# Patient Record
Sex: Male | Born: 2000 | Race: White | Hispanic: No | Marital: Single | Smoking: Never smoker
Health system: Southern US, Community
[De-identification: ages and names within clinical notes are randomized; demographics above are authoritative.]

## PROBLEM LIST (undated history)

## (undated) DIAGNOSIS — F909 Attention-deficit hyperactivity disorder, unspecified type: Secondary | ICD-10-CM

## (undated) HISTORY — DX: Attention-deficit hyperactivity disorder, unspecified type: F90.9

## (undated) HISTORY — PX: TYMPANOSTOMY TUBE PLACEMENT: SHX32

---
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
## 2020-01-23 ENCOUNTER — Other Ambulatory Visit: Payer: Self-pay

## 2020-01-23 ENCOUNTER — Encounter: Payer: Self-pay | Admitting: Internal Medicine

## 2020-01-23 ENCOUNTER — Ambulatory Visit (INDEPENDENT_AMBULATORY_CARE_PROVIDER_SITE_OTHER): Payer: BC Managed Care – PPO | Admitting: Internal Medicine

## 2020-01-23 DIAGNOSIS — Z0000 Encounter for general adult medical examination without abnormal findings: Secondary | ICD-10-CM | POA: Diagnosis not present

## 2020-01-23 DIAGNOSIS — F900 Attention-deficit hyperactivity disorder, predominantly inattentive type: Secondary | ICD-10-CM

## 2020-01-23 MED ORDER — AMPHETAMINE-DEXTROAMPHETAMINE 20 MG PO TABS: 20.0000 mg | ORAL_TABLET | Freq: Every day | ORAL | 0 refills | Status: DC

## 2020-01-23 MED ORDER — AMPHETAMINE-DEXTROAMPHETAMINE 5 MG PO TABS: 5.0000 mg | ORAL_TABLET | Freq: Every day | ORAL | 0 refills | Status: DC

## 2020-01-23 NOTE — Assessment & Plan Note (Signed)
Uses just for school Will add 5mg  dose for afternoon when he has late classes

## 2020-01-23 NOTE — Progress Notes (Signed)
Subjective:    Patient ID: Thomas Nunez, male    DOB: [birth year], 19 y.o.   MRN: [id number]  HPI Here for physical This visit occurred during the SARS-CoV-2 public health emergency.  Safety protocols were in place, including screening questions prior to the visit, additional usage of staff PPE, and extensive cleaning of exam room while observing appropriate contact time as indicated for disinfecting solutions.   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 amphetamine-dextroamphetamine (ADDERALL) 20 MG tablet Take 1 tablet (20 mg total) by mouth daily. For school 30 tablet 0   No current facility-administered medications on file prior to visit.    No Known Allergies  Past Medical History:  Diagnosis Date  . ADHD     Past Surgical History:  Procedure Laterality Date  . TYMPANOSTOMY TUBE PLACEMENT      Family History  Problem Relation Age of Onset  . Breast cancer Maternal Grandmother   . Depression Neg Hx   . Heart disease Neg Hx     Social History   Socioeconomic History  . Marital status: Single    Spouse name: Not on file  . Number of children: Not on file  . Years of education: Not on file  . Highest education level: Not on file  Occupational History  . Not on file  Tobacco Use  . Smoking status: Never Smoker  . Smokeless tobacco: Never Used  Substance and Sexual Activity  . Alcohol use: No  . Drug use: No  . Sexual activity: Not on file  Other Topics Concern  . 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   Social Determinants of Health   Financial Resource Strain:   . Difficulty of Paying Living Expenses:   Food Insecurity:   . Worried About Programme researcher, broadcasting/film/video in the Last Year:   . Barista in the Last Year:   Transportation Needs:   . Freight forwarder (Medical):   Marland Kitchen Lack of Transportation (Non-Medical):   Physical Activity:   . Days of Exercise per Week:   . Minutes of Exercise per Session:   Stress:   . Feeling of Stress :   Social Connections:   . Frequency of Communication with Friends and Family:   . Frequency of Social Gatherings with Friends and Family:   . Attends Religious Services:   . Active Member of Clubs or Organizations:   . Attends Banker Meetings:   Marland Kitchen Marital Status:   Intimate Partner Violence:   . Fear of Current or Ex-Partner:   . Emotionally Abused:   Marland Kitchen Physically Abused:   . Sexually Abused:    Review of Systems  Constitutional: Negative for fatigue and unexpected weight change.       Wears seat belt  HENT: Negative for dental problem, hearing loss and tinnitus.   Eyes: Negative for visual disturbance.       No  diplopia or unilateral vision  Respiratory: Negative for cough, chest tightness and shortness of breath.   Cardiovascular: Negative for chest pain, palpitations and leg swelling.  Gastrointestinal: Negative for blood in stool and constipation.       No heartburn  Endocrine: Negative for polydipsia and polyuria.  Genitourinary: Negative for difficulty urinating and urgency.       Discussed safe sex  Musculoskeletal: Negative for arthralgias and joint swelling.       Occ back pain from golf  Skin: Negative for rash.  Allergic/Immunologic: Positive for environmental allergies. Negative for immunocompromised state.       Mild symptoms ---no meds  Neurological: Negative for  dizziness, syncope, light-headedness and headaches.  Hematological: Negative for adenopathy. Does not bruise/bleed easily.  Psychiatric/Behavioral: Negative for dysphoric mood and sleep disturbance. The patient is not nervous/anxious.        Objective:   Physical Exam  Constitutional: He is oriented to person, place, and time. He appears well-developed. No distress.  HENT:  Head: Normocephalic and atraumatic.  Right Ear: External ear normal.  Left Ear: External ear normal.  Mouth/Throat: Oropharynx is clear and moist. No oropharyngeal exudate.  Eyes: Pupils are equal, round, and reactive to light. Conjunctivae are normal.  Neck: No thyromegaly present.  Cardiovascular: Normal rate, regular rhythm, normal heart sounds and intact distal pulses. Exam reveals no gallop.  No murmur heard. Respiratory: Effort normal and breath sounds normal. No respiratory distress. He has no wheezes. He has no rales.  GI: Soft. There is no abdominal tenderness.  Genitourinary:    Genitourinary Comments: Normal testes   Musculoskeletal:        General: No tenderness or edema.  Lymphadenopathy:    He has no cervical adenopathy.  Neurological: He is alert and oriented to person, place, and time.  Skin: No rash noted. No erythema.  Psychiatric: He has a normal mood and affect. His behavior is normal.           Assessment & Plan:

## 2020-01-23 NOTE — Assessment & Plan Note (Signed)
Healthy Discussed safety, seat belts, safe sex, substance avoidance Prefers no COVID or flu vaccines Working on fitness

## 2020-02-03 ENCOUNTER — Other Ambulatory Visit: Payer: Self-pay | Admitting: Internal Medicine

## 2020-02-03 NOTE — Telephone Encounter (Signed)
Name of Medication: adderall 20 mg Name of Pharmacy:[facility] [place] or Written Date and Quantity: # 30 on 12/10/19 Last Office Visit and Type: 01/23/20 annual Next Office Visit and Type: 01/25/21 CPX  Name of Medication: adderall 5 mg Name of Pharmacy:[facility] [place] or Written Date and Quantity: new fill Last Office Visit and Type: 01/23/20 annual Next Office Visit and Type: 01/25/21 CPX

## 2020-02-03 NOTE — Telephone Encounter (Signed)
Patient's mother called to request refill  Adderall   She stated that he is completely out of medication.   [facility]

## 2020-02-04 MED ORDER — AMPHETAMINE-DEXTROAMPHETAMINE 5 MG PO TABS: 5.0000 mg | ORAL_TABLET | Freq: Every day | ORAL | 0 refills | Status: DC

## 2020-02-04 MED ORDER — AMPHETAMINE-DEXTROAMPHETAMINE 20 MG PO TABS: 20.0000 mg | ORAL_TABLET | Freq: Every day | ORAL | 0 refills | Status: DC

## 2020-03-09 ENCOUNTER — Telehealth: Payer: Self-pay | Admitting: Internal Medicine

## 2020-03-09 NOTE — Telephone Encounter (Signed)
Patient's mother came in office and is requesting pcp fill out form for college gold and ADD form. Please advise. Form placed in tower.

## 2020-03-10 NOTE — Telephone Encounter (Signed)
Form placed in Dr Letvak's inbox on his desk 

## 2020-03-15 NOTE — Telephone Encounter (Signed)
Form done as best as I can We do not have a full ADHD evaluation and documentation since it goes back many years. Can send my last note--but not sure that will satisfy them. May need separate visit to document DSM criteria, etc

## 2020-03-15 NOTE — Telephone Encounter (Signed)
Tried to call mom, Judeth Cornfield. VM is full. He also needs a vision screening unless he has been to the eye doctor in the last year.

## 2020-03-19 NOTE — Telephone Encounter (Signed)
Spoke to pt's Mom. She said they were not too picky about it last year. Will let us know if they need a copy of the OV Note. Forms up front for his to pickup.

## 2020-03-26 ENCOUNTER — Other Ambulatory Visit: Payer: Self-pay | Admitting: Internal Medicine

## 2020-03-26 MED ORDER — AMPHETAMINE-DEXTROAMPHETAMINE 5 MG PO TABS: 5.0000 mg | ORAL_TABLET | Freq: Every day | ORAL | 0 refills | Status: DC

## 2020-03-26 MED ORDER — AMPHETAMINE-DEXTROAMPHETAMINE 20 MG PO TABS: 20.0000 mg | ORAL_TABLET | Freq: Every day | ORAL | 0 refills | Status: DC

## 2020-03-26 NOTE — Telephone Encounter (Signed)
Patients mother called stating the pharmacy did not have patients Adderall prescription refilled. Patient leaves for college 8/13, please call patient when refill is sent!

## 2020-06-23 DIAGNOSIS — J029 Acute pharyngitis, unspecified: Secondary | ICD-10-CM | POA: Diagnosis not present

## 2020-07-14 ENCOUNTER — Telehealth: Payer: Self-pay | Admitting: Internal Medicine

## 2020-07-14 ENCOUNTER — Encounter: Payer: Self-pay | Admitting: Internal Medicine

## 2020-07-14 NOTE — Telephone Encounter (Signed)
I left 2 messages on Stephanie,patient's mother,voice mail with no response. Patient needs to reschedule physical appointment with Dr.Letvak on 01/25/21. I mailed a reschedule letter to patient.

## 2020-07-27 ENCOUNTER — Other Ambulatory Visit: Payer: Self-pay | Admitting: Internal Medicine

## 2020-07-27 MED ORDER — AMPHETAMINE-DEXTROAMPHETAMINE 5 MG PO TABS: 5.0000 mg | ORAL_TABLET | Freq: Every day | ORAL | 0 refills | Status: DC

## 2020-07-27 MED ORDER — AMPHETAMINE-DEXTROAMPHETAMINE 20 MG PO TABS: 20.0000 mg | ORAL_TABLET | Freq: Every day | ORAL | 0 refills | Status: DC

## 2020-07-27 NOTE — Telephone Encounter (Signed)
Both last filled 03-26-20 Last OV/CPE 01-26-20 No Future OV [facility]

## 2020-07-27 NOTE — Telephone Encounter (Signed)
Pt mom called needed to get a refill on Adderall.   [facility]  [id number]  [phone]

## 2020-08-09 DIAGNOSIS — S8390XA Sprain of unspecified site of unspecified knee, initial encounter: Secondary | ICD-10-CM | POA: Diagnosis not present

## 2020-09-24 ENCOUNTER — Telehealth: Payer: Self-pay | Admitting: Internal Medicine

## 2020-09-24 NOTE — Telephone Encounter (Signed)
These refills are too soon. He was given 90 days in December. He is not due for refills until March.

## 2020-09-24 NOTE — Telephone Encounter (Signed)
Error EM 

## 2020-09-24 NOTE — Telephone Encounter (Signed)
Spoke to pt's Mom. Advised her that we can send a rx to a pharmacy at school if that is convenient for him.

## 2020-09-24 NOTE — Telephone Encounter (Signed)
Patient is in need of both of his prescriptions of Adderall Pharmacy [facility] [address]  Thank you EM  He is in from college and out of it.

## 2020-09-26 NOTE — Telephone Encounter (Signed)
I agree No reason I can't send to a pharmacy of his choice that he can pick up near his school

## 2020-10-27 MED ORDER — AMPHETAMINE-DEXTROAMPHETAMINE 5 MG PO TABS: 5.0000 mg | ORAL_TABLET | Freq: Every day | ORAL | 0 refills | Status: DC

## 2020-10-27 MED ORDER — AMPHETAMINE-DEXTROAMPHETAMINE 20 MG PO TABS: 20.0000 mg | ORAL_TABLET | Freq: Every day | ORAL | 0 refills | Status: DC

## 2020-10-27 NOTE — Telephone Encounter (Signed)
Pharmacy requests refill on: Amphetamine-Dextroamphetamine 20 mg & 5 mg   LAST REFILL: 07/27/2020 (Q-90, R-0) LAST OV: 01/23/2020 NEXT OV: Not Scheduled  PHARMACY: [id number] [place], [place]

## 2021-01-19 MED ORDER — AMPHETAMINE-DEXTROAMPHETAMINE 20 MG PO TABS: 20.0000 mg | ORAL_TABLET | Freq: Every day | ORAL | 0 refills | Status: DC

## 2021-01-19 MED ORDER — AMPHETAMINE-DEXTROAMPHETAMINE 5 MG PO TABS: 5.0000 mg | ORAL_TABLET | Freq: Every day | ORAL | 0 refills | Status: DC

## 2021-01-19 NOTE — Telephone Encounter (Signed)
Both last written 10-27-20 #90 Last OV/CPE 01-23-20 No Future OV [facility]

## 2021-01-19 NOTE — Telephone Encounter (Signed)
He needs a PE or at least follow up in the next couple of months

## 2021-01-25 ENCOUNTER — Encounter: Payer: BC Managed Care – PPO | Admitting: Internal Medicine

## 2021-03-31 ENCOUNTER — Encounter: Payer: Self-pay | Admitting: Internal Medicine

## 2021-03-31 ENCOUNTER — Other Ambulatory Visit: Payer: Self-pay

## 2021-03-31 ENCOUNTER — Ambulatory Visit (INDEPENDENT_AMBULATORY_CARE_PROVIDER_SITE_OTHER): Payer: Self-pay | Admitting: Internal Medicine

## 2021-03-31 DIAGNOSIS — F900 Attention-deficit hyperactivity disorder, predominantly inattentive type: Secondary | ICD-10-CM

## 2021-03-31 NOTE — Assessment & Plan Note (Signed)
Doing well with intermittent use Fills the Rx about every 3 months Will give letter for school competition

## 2021-03-31 NOTE — Progress Notes (Signed)
   Subjective:    Patient ID: Thomas Nunez, male    DOB: May 23, 2001, 20 y.o.   MRN: [id number]  HPI Here for follow up of ADHD This visit occurred during the SARS-CoV-2 public health emergency.  Safety protocols were in place, including screening questions prior to the visit, additional usage of staff PPE, and extensive cleaning of exam room while observing appropriate contact time as indicated for disinfecting solutions.   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 For school 90 tablet 0   amphetamine-dextroamphetamine (ADDERALL) 5 MG tablet Take 1 tablet (5 mg total) by mouth daily. In afternoon as needed 90 tablet 0   No current facility-administered medications on file prior to visit.    No Known Allergies  Past Medical History:  Diagnosis Date   ADHD     Past Surgical History:  Procedure Laterality Date   TYMPANOSTOMY TUBE PLACEMENT      Family History  Problem Relation Age of Onset   Breast cancer Maternal Grandmother    Depression Neg Hx    Heart disease Neg Hx     Social History   Socioeconomic History   Marital status: Single    Spouse name: Not on file   Number of children: Not on file   Years of education: Not on file   Highest education level: Not on file  Occupational History   Not on file  Tobacco Use   Smoking status: Never   Smokeless tobacco: Never  Substance and Sexual Activity   Alcohol use: No   Drug use: No   Sexual activity: Not on file  Other Topics Concern   Not on file   Social History Narrative   Starting Middle school at [employer] 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   Social Determinants of Health   Financial Resource Strain: Not on file  Food Insecurity: Not on file  Transportation Needs: Not on file  Physical Activity: Not on file  Stress: Not on file  Social Connections: Not on file  Intimate Partner Violence: Not on file    Review of Systems Has been working out in the weight room Works out regularly No chest pain or SOB Sleeps well No sexual problems    Objective:   Physical Exam Constitutional:      Appearance: Normal appearance.  Neurological:     Mental Status: He is alert.  Psychiatric:        Mood and Affect: Mood normal.        Behavior: Behavior normal.           Assessment & Plan:

## 2021-07-27 ENCOUNTER — Other Ambulatory Visit: Payer: Self-pay | Admitting: Internal Medicine

## 2021-07-28 MED ORDER — AMPHETAMINE-DEXTROAMPHETAMINE 20 MG PO TABS: 20.0000 mg | ORAL_TABLET | Freq: Every day | ORAL | 0 refills | Status: DC

## 2021-07-28 MED ORDER — AMPHETAMINE-DEXTROAMPHETAMINE 5 MG PO TABS: 5.0000 mg | ORAL_TABLET | Freq: Every day | ORAL | 0 refills | Status: DC

## 2021-11-30 ENCOUNTER — Encounter: Payer: Self-pay | Admitting: Internal Medicine

## 2021-12-01 MED ORDER — AMPHETAMINE-DEXTROAMPHETAMINE 5 MG PO TABS: 5.0000 mg | ORAL_TABLET | Freq: Every day | ORAL | 0 refills | Status: DC

## 2021-12-01 MED ORDER — AMPHETAMINE-DEXTROAMPHETAMINE 20 MG PO TABS: 20.0000 mg | ORAL_TABLET | Freq: Every day | ORAL | 0 refills | Status: DC

## 2022-02-25 ENCOUNTER — Other Ambulatory Visit: Payer: Self-pay | Admitting: Internal Medicine

## 2022-02-27 MED ORDER — AMPHETAMINE-DEXTROAMPHETAMINE 5 MG PO TABS: 5.0000 mg | ORAL_TABLET | Freq: Every day | ORAL | 0 refills | Status: DC

## 2022-02-27 MED ORDER — AMPHETAMINE-DEXTROAMPHETAMINE 20 MG PO TABS: 20.0000 mg | ORAL_TABLET | Freq: Every day | ORAL | 0 refills | Status: DC

## 2022-02-27 NOTE — Telephone Encounter (Signed)
Both last filled 12-01-21 Last OV 03-31-21 Next OV 04-03-22 [facility]

## 2022-02-28 MED ORDER — AMPHETAMINE-DEXTROAMPHETAMINE 20 MG PO TABS: 20.0000 mg | ORAL_TABLET | Freq: Every day | ORAL | 0 refills | Status: AC

## 2022-02-28 MED ORDER — AMPHETAMINE-DEXTROAMPHETAMINE 5 MG PO TABS: 5.0000 mg | ORAL_TABLET | Freq: Every day | ORAL | 0 refills | Status: AC

## 2022-02-28 NOTE — Addendum Note (Signed)
Addended by: Eual Fines on: 02/28/2022 07:23 AM   Modules accepted: Orders

## 2022-02-28 NOTE — Addendum Note (Signed)
Addended by: Tillman Abide I on: 02/28/2022 12:34 PM   Modules accepted: Orders

## 2022-03-01 ENCOUNTER — Encounter: Payer: Self-pay | Admitting: Internal Medicine

## 2022-04-03 ENCOUNTER — Encounter: Payer: Self-pay | Admitting: Internal Medicine

## 2022-07-07 ENCOUNTER — Encounter: Payer: Self-pay | Admitting: Internal Medicine

## 2023-04-12 ENCOUNTER — Other Ambulatory Visit: Payer: Self-pay

## 2023-04-12 DIAGNOSIS — Z0000 Encounter for general adult medical examination without abnormal findings: Secondary | ICD-10-CM

## 2023-04-13 ENCOUNTER — Ambulatory Visit: Payer: Self-pay | Admitting: Adult Health

## 2023-04-14 LAB — TSH: TSH: 1.51 u[IU]/mL (ref 0.450–4.500)

## 2023-04-14 LAB — COMPREHENSIVE METABOLIC PANEL
ALT: 230 IU/L — ABNORMAL HIGH (ref 0–44)
AST: 121 IU/L — ABNORMAL HIGH (ref 0–40)
Albumin: 4.8 g/dL (ref 4.3–5.2)
Alkaline Phosphatase: 68 IU/L (ref 44–121)
BUN/Creatinine Ratio: 16 (ref 9–20)
BUN: 16 mg/dL (ref 6–20)
Bilirubin Total: 0.5 mg/dL (ref 0.0–1.2)
CO2: 18 mmol/L — ABNORMAL LOW (ref 20–29)
Calcium: 9.9 mg/dL (ref 8.7–10.2)
Chloride: 103 mmol/L (ref 96–106)
Creatinine, Ser: 0.98 mg/dL (ref 0.76–1.27)
Globulin, Total: 2.8 g/dL (ref 1.5–4.5)
Glucose: 99 mg/dL (ref 70–99)
Potassium: 4.4 mmol/L (ref 3.5–5.2)
Sodium: 138 mmol/L (ref 134–144)
Total Protein: 7.6 g/dL (ref 6.0–8.5)
eGFR: 112 mL/min/{1.73_m2} (ref >59)

## 2023-04-14 LAB — CBC WITH DIFFERENTIAL/PLATELET
Basophils Absolute: 0.1 10*3/uL (ref 0.0–0.2)
Basos: 1 %
EOS (ABSOLUTE): 0.1 10*3/uL (ref 0.0–0.4)
Eos: 1 %
Hematocrit: 51.7 % — ABNORMAL HIGH (ref 37.5–51.0)
Hemoglobin: 17.6 g/dL (ref 13.0–17.7)
Immature Grans (Abs): 0 10*3/uL (ref 0.0–0.1)
Immature Granulocytes: 0 %
Lymphocytes Absolute: 2.6 10*3/uL (ref 0.7–3.1)
Lymphs: 46 %
MCH: 30.3 pg (ref 26.6–33.0)
MCHC: 34 g/dL (ref 31.5–35.7)
MCV: 89 fL (ref 79–97)
Monocytes Absolute: 0.6 10*3/uL (ref 0.1–0.9)
Monocytes: 10 %
Neutrophils Absolute: 2.4 10*3/uL (ref 1.4–7.0)
Neutrophils: 42 %
Platelets: 295 10*3/uL (ref 150–450)
RBC: 5.81 x10E6/uL — ABNORMAL HIGH (ref 4.14–5.80)
RDW: 13.2 % (ref 11.6–15.4)
WBC: 5.6 10*3/uL (ref 3.4–10.8)

## 2023-04-14 LAB — LIPID PANEL WITH LDL/HDL RATIO
Cholesterol, Total: 223 mg/dL — ABNORMAL HIGH (ref 100–199)
HDL: 38 mg/dL — ABNORMAL LOW (ref >39)
LDL Chol Calc (NIH): 135 mg/dL — ABNORMAL HIGH (ref 0–99)
LDL/HDL Ratio: 3.6 ratio (ref 0.0–3.6)
Triglycerides: 276 mg/dL — ABNORMAL HIGH (ref 0–149)
VLDL Cholesterol Cal: 50 mg/dL — ABNORMAL HIGH (ref 5–40)

## 2023-04-14 LAB — VITAMIN D 25 HYDROXY (VIT D DEFICIENCY, FRACTURES): Vit D, 25-Hydroxy: 35.9 ng/mL (ref 30.0–100.0)

## 2023-04-14 LAB — VITAMIN B12: Vitamin B-12: 1104 pg/mL (ref 232–1245)

## 2023-04-16 ENCOUNTER — Other Ambulatory Visit: Payer: Self-pay

## 2023-04-16 ENCOUNTER — Ambulatory Visit (INDEPENDENT_AMBULATORY_CARE_PROVIDER_SITE_OTHER): Payer: Self-pay | Admitting: Physician Assistant

## 2023-04-16 ENCOUNTER — Encounter: Payer: Self-pay | Admitting: Physician Assistant

## 2023-04-16 VITALS — BP 140/90 | HR 87 | Ht 72.0 in | Wt 243.0 lb

## 2023-04-16 DIAGNOSIS — E785 Hyperlipidemia, unspecified: Secondary | ICD-10-CM

## 2023-04-16 DIAGNOSIS — R748 Abnormal levels of other serum enzymes: Secondary | ICD-10-CM

## 2023-04-16 NOTE — Progress Notes (Signed)
[facility] [address] [place], [place] [address]   Office Visit Note  Patient Name: Thomas Nunez Date of Birth [birth year]  Medical Record number [id number]  Date of Service: 04/16/2023  Chief Complaint  Patient presents with   Annual Exam     22 y/o M presents to the clinic for blood work. Patient is doing well. No concerns. He just graduated from [employer] and will be working soon. He is moving back to the area from [place]. Denies h/o abuse with alcohol or anti-inflammatory medicines. Currently takes occasional otc Tylenol for headache. He drinks once a month with friends. Does admit to not eating well. Fast-food more often than he'd like to due to busy schedule. Plays golf daily. No h/o liver disease. No nausea/vomiting/ fatigue.       Current Medication:  Outpatient Encounter Medications as of 04/16/2023  Medication Sig   acetaminophen (TYLENOL) 325 MG tablet Take 650 mg by mouth as needed. For headache   amphetamine-dextroamphetamine (ADDERALL) 20 MG tablet Take 1 tablet (20 mg total) by mouth daily. For school   amphetamine-dextroamphetamine (ADDERALL) 5 MG tablet Take 1 tablet (5 mg total) by mouth daily. In afternoon as needed   No facility-administered encounter medications on file as of 04/16/2023.      Medical History: Past Medical History:  Diagnosis Date   ADHD      Vital Signs: BP (!) 140/90 (BP Location: Left Arm, Cuff Size: Large)   Pulse 87   Ht 6' (1.829 m)   Wt 243 lb (110.2 kg)   SpO2 97%   BMI 32.96 kg/m    Review of Systems  Constitutional: Negative.   HENT: Negative.    Gastrointestinal: Negative.   Genitourinary: Negative.   Skin: Negative.   Neurological: Negative.     Physical Exam Constitutional:      Appearance: Normal appearance.  HENT:     Head: Normocephalic and atraumatic.     Right Ear: External ear normal.     Left Ear: External ear normal.  Eyes:     Extraocular Movements: Extraocular movements intact.   Cardiovascular:     Rate and Rhythm: Normal rate and regular rhythm.  Abdominal:     General: Abdomen is flat. Bowel sounds are normal.     Palpations: Abdomen is soft.     Tenderness: There is no abdominal tenderness. There is no guarding.  Skin:    General: Skin is warm and dry.  Neurological:     Mental Status: He is alert and oriented to person, place, and time.  Psychiatric:        Mood and Affect: Mood normal.        Behavior: Behavior normal.       Assessment/Plan:  1. Elevated liver enzymes - Comprehensive metabolic panel; Future - Hepatic function panel; Future  2. Dyslipidemia  Ddx: Metabolic Syndrom vs Non-Alcoholic Fatty Liver Disease vs use of anti-inflammatory medicines  Reviewed blood work results with patient. RTC in 4-6 weeks to repeat liver enzyme blood work. Eat healthier diet. Continue to exercise daily.  Incorporate fish in diet or start otc 2,000mg  of Omega 3-fish oil daily.  Repeat cholesterol blood work in 6 months.  Consider liver u/s if no improvement of liver enzymes. Get established with PCP in the community for further evaluation and management.  General Counseling: Thomas Nunez understanding of the findings of todays visit and agrees with plan of treatment. I have discussed any further diagnostic evaluation that may be  needed or ordered today. We also reviewed his medications today. he has been encouraged to call the office with any questions or concerns that should arise related to todays visit.    Time spent:30 Minutes    Gilberto Better, [place] Physician Assistant
